# Patient Record
Sex: Female | Born: 1975 | Race: White | Hispanic: No | Marital: Married | Smoking: Never smoker
Health system: Southern US, Community
[De-identification: ages and names within clinical notes are randomized; demographics above are authoritative.]

---

## 2001-04-13 ENCOUNTER — Encounter: Payer: Self-pay | Admitting: Family Medicine

## 2001-04-13 ENCOUNTER — Encounter: Admission: RE | Admit: 2001-04-13 | Discharge: 2001-04-13 | Payer: Self-pay | Admitting: Family Medicine

## 2006-09-12 ENCOUNTER — Emergency Department (HOSPITAL_COMMUNITY): Admission: EM | Admit: 2006-09-12 | Discharge: 2006-09-12 | Payer: Self-pay | Admitting: Emergency Medicine

## 2006-09-15 ENCOUNTER — Emergency Department (HOSPITAL_COMMUNITY): Admission: EM | Admit: 2006-09-15 | Discharge: 2006-09-15 | Payer: Self-pay | Admitting: Emergency Medicine

## 2006-10-29 ENCOUNTER — Other Ambulatory Visit: Admission: RE | Admit: 2006-10-29 | Discharge: 2006-10-29 | Payer: Self-pay | Admitting: Obstetrics & Gynecology

## 2008-01-21 ENCOUNTER — Other Ambulatory Visit: Admission: RE | Admit: 2008-01-21 | Discharge: 2008-01-21 | Payer: Self-pay | Admitting: Obstetrics & Gynecology

## 2008-02-22 ENCOUNTER — Other Ambulatory Visit: Admission: RE | Admit: 2008-02-22 | Discharge: 2008-02-22 | Payer: Self-pay | Admitting: Obstetrics and Gynecology

## 2008-08-04 IMAGING — CT CT ABDOMEN W/ CM
1 of 3 series · 14 of 32 positions shown, 19 images · IV contrast (omnipaque)
Comparison: none

CLINICAL DATA: 30 year-old with abdominal pain and pelvic pain. 
ABDOMEN CT WITH CONTRAST:
TECHNIQUE: Multidetector CT imaging of the abdomen was performed following the standard protocol during bolus administration of intravenous contrast.
Contrast:  125 cc Omnipaque 300
TECHNIQUE: Multidetector CT imaging of the pelvis was performed following the standard protocol during bolus administration of intravenous contrast.

[Series 2: abd_pel 5.0 b40f st · axial · 0.59mm/px · z∈[-400,-40]mm · 14 of 81 slices shown, 19 images]
[im 5/81  soft-tissue]
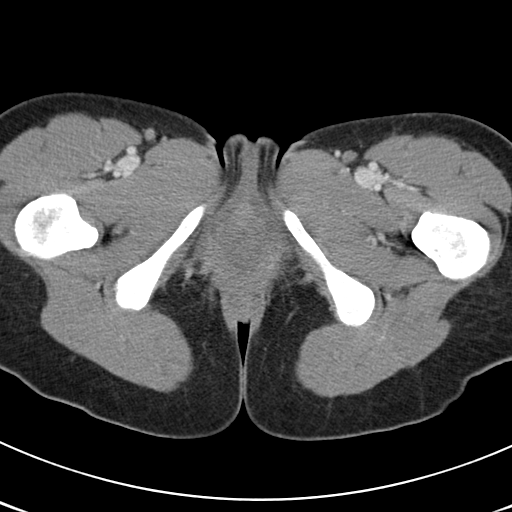
[im 5/81  bone]
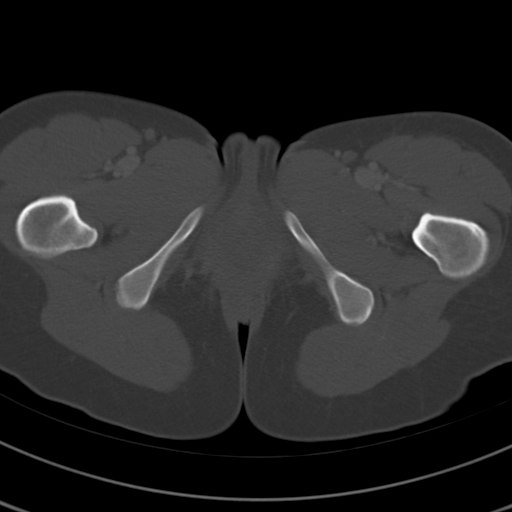
[im 13/81  soft-tissue]
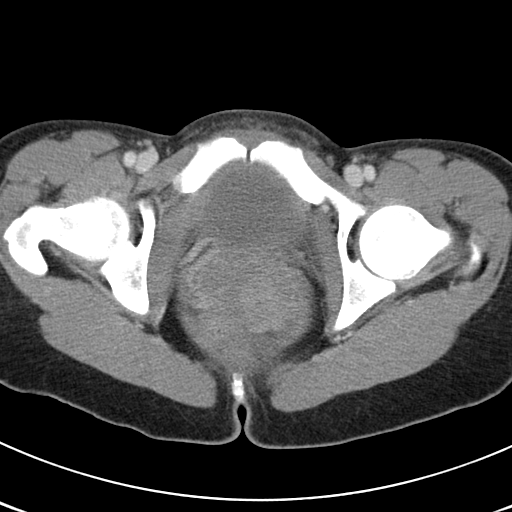
[im 17/81  soft-tissue]
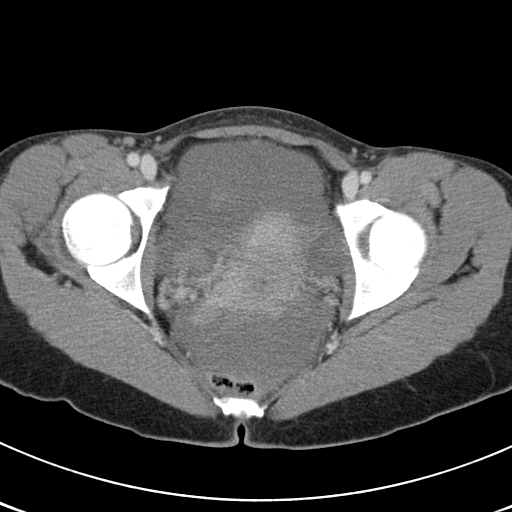
[im 25/81  soft-tissue]
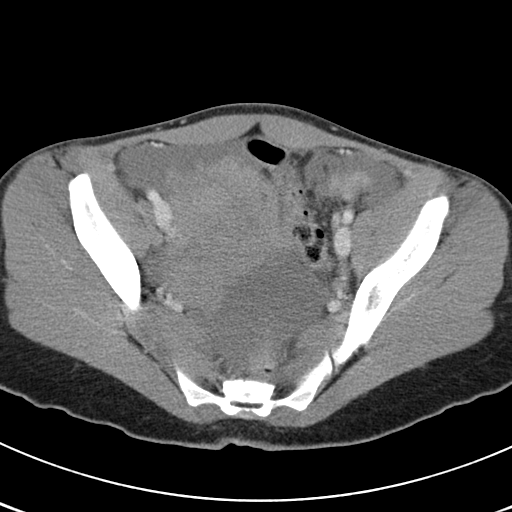
[im 29/81  soft-tissue]
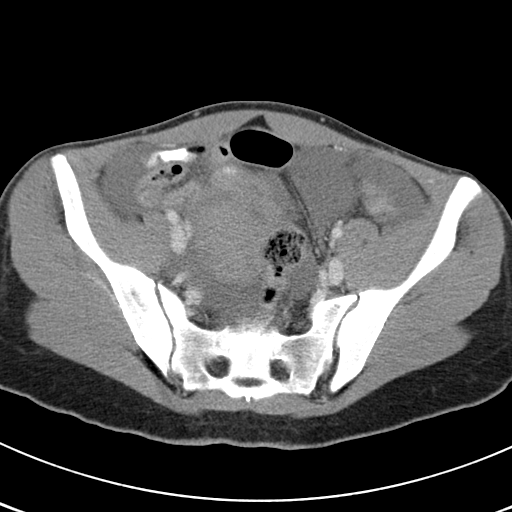
[im 37/81  soft-tissue]
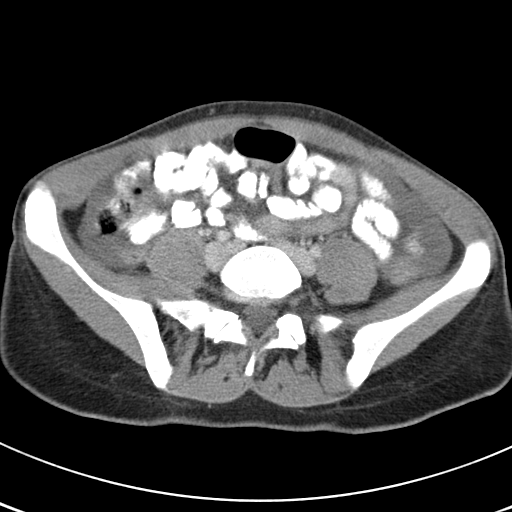
[im 41/81  soft-tissue]
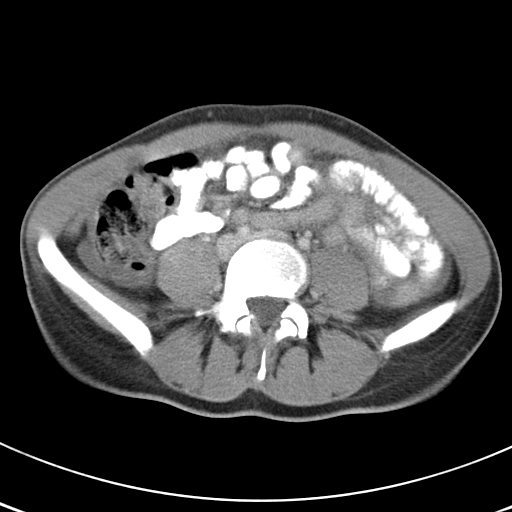
[im 45/81  soft-tissue]
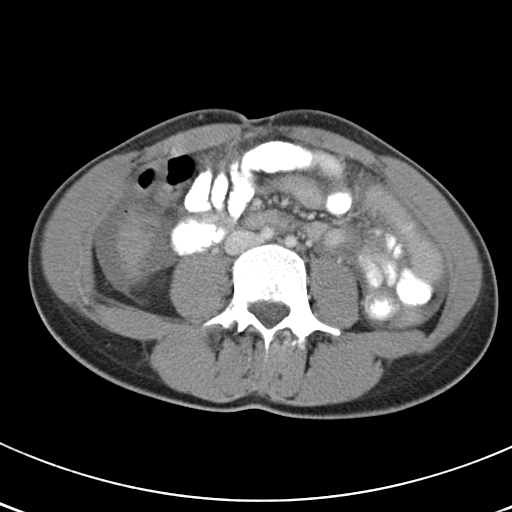
[im 53/81  soft-tissue]
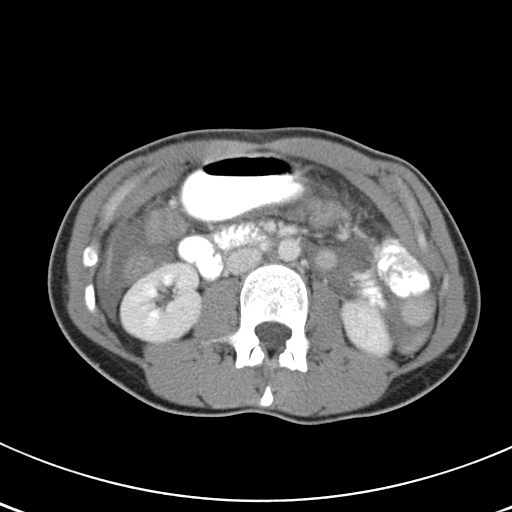
[im 53/81  bone]
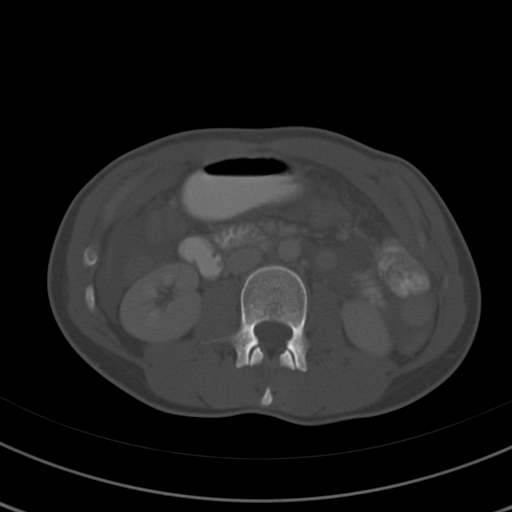
[im 57/81  soft-tissue]
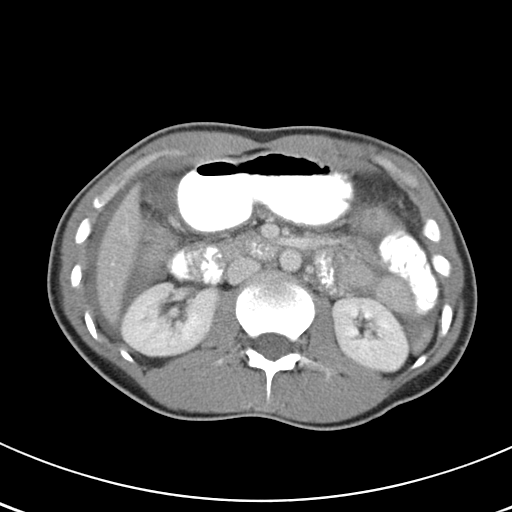
[im 65/81  soft-tissue]
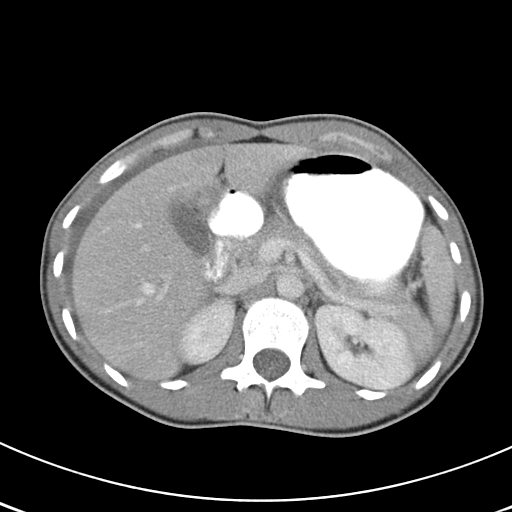
[im 65/81  lung]
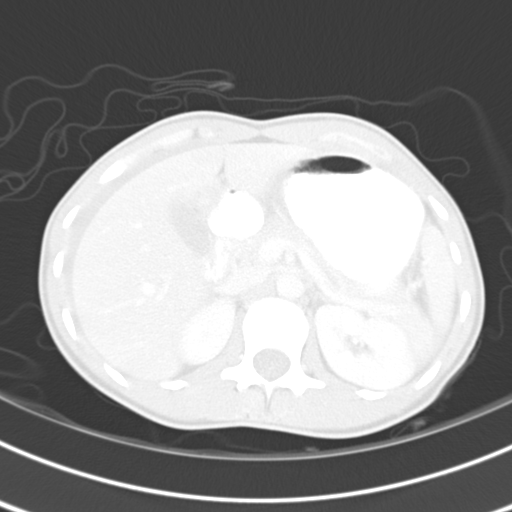
[im 69/81  soft-tissue]
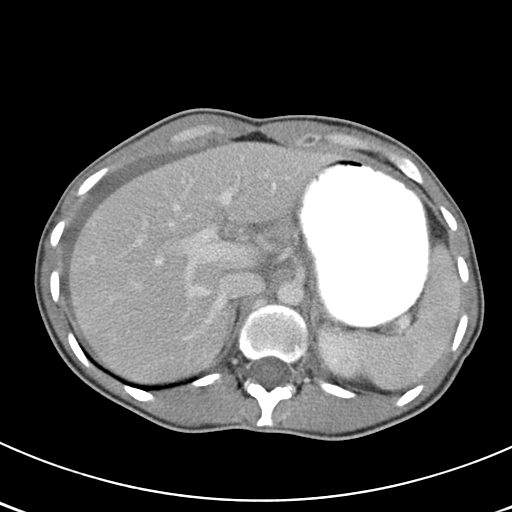
[im 69/81  lung]
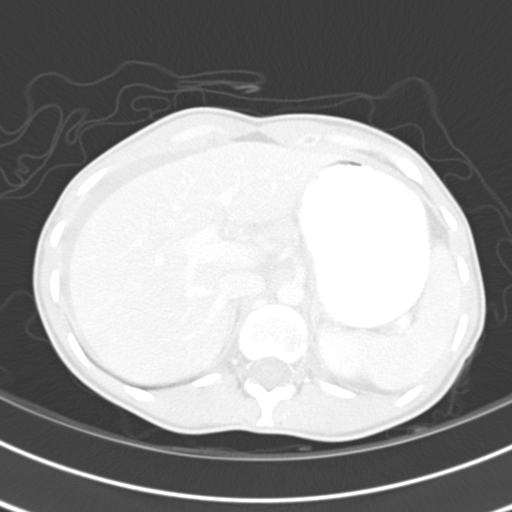
[im 73/81  lung]
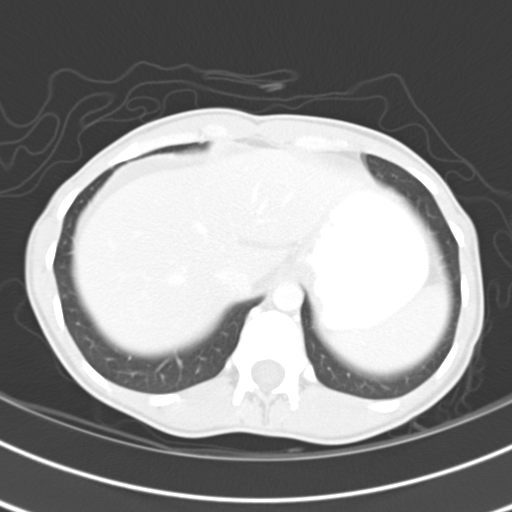
[im 77/81  soft-tissue]
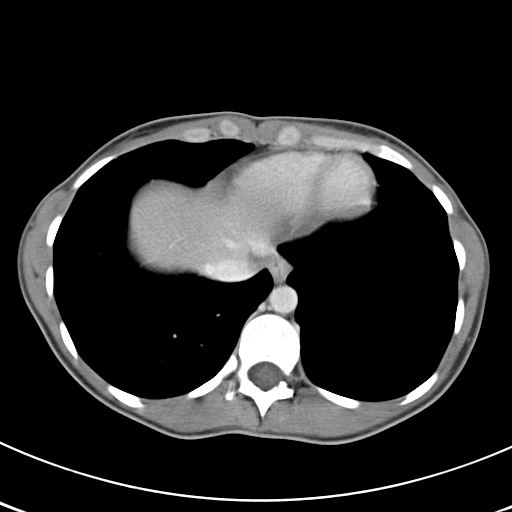
[im 77/81  lung]
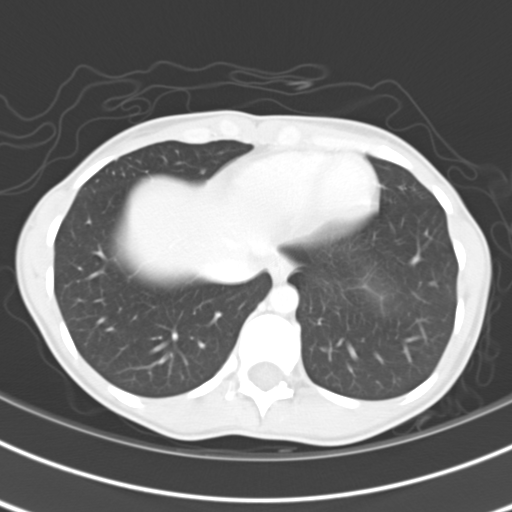

[14 of 32 positions shown; findings below may reference images not displayed]

FINDINGS: Lung bases are clear.  Heart size is normal.  There is a tiny nodule at the left lung base which is probably a benign intrapulmonary lymph node.
There is a moderate amount of free abdominal fluid around the liver and spleen.  The liver, spleen, pancreas, adrenal glands, and kidneys demonstrate no significant finding.  The stomach, duodenum, small bowel, and colon are unremarkable.  Aorta is normal in caliber.  No mesenteric or retroperitoneal masses or adenopathy.  Gallbladder appears normal.
IMPRESSION: 1.  Moderate amount of free abdominal fluid without obvious cause in the abdomen.  The major solid parenchymal organs are normal.
PELVIS CT WITH CONTRAST:
FINDINGS: There is a right adnexal process with an area of low attenuation surrounded by some areas of higher attenuation.  There is also a large amount of free pelvic fluid along with some higher attenuation material which is likely hemorrhage.  There is a right adnexal process that may be a ruptured cyst.  Would recommend pregnancy test to make sure this is not a ruptured ectopic pregnancy.  Small amount of fluid in the endometrial cavity.  Left ovary is not well seen.  The appendix is not visualized for certain.
IMPRESSION: 1.  Large amount of free fluid and blood in the pelvis.  This likely a ruptured ovarian cyst but need to exclude a ruptured ectopic pregnancy.

## 2010-12-20 ENCOUNTER — Other Ambulatory Visit: Payer: Self-pay | Admitting: Family Medicine

## 2010-12-20 ENCOUNTER — Encounter: Payer: Self-pay | Admitting: Family Medicine

## 2010-12-20 ENCOUNTER — Other Ambulatory Visit (HOSPITAL_COMMUNITY)
Admission: RE | Admit: 2010-12-20 | Discharge: 2010-12-20 | Disposition: A | Discharge status: Home / Self Care | Payer: Self-pay | Source: Ambulatory Visit | Attending: Family Medicine | Admitting: Family Medicine

## 2010-12-20 DIAGNOSIS — N72 Inflammatory disease of cervix uteri: Secondary | ICD-10-CM | POA: Insufficient documentation

## 2010-12-20 DIAGNOSIS — R87612 Low grade squamous intraepithelial lesion on cytologic smear of cervix (LGSIL): Secondary | ICD-10-CM

## 2010-12-20 LAB — POCT PREGNANCY, URINE: Preg Test, Ur: NEGATIVE

## 2011-01-18 ENCOUNTER — Ambulatory Visit (INDEPENDENT_AMBULATORY_CARE_PROVIDER_SITE_OTHER): Payer: 59 | Admitting: Family Medicine

## 2011-01-18 DIAGNOSIS — R87612 Low grade squamous intraepithelial lesion on cytologic smear of cervix (LGSIL): Secondary | ICD-10-CM

## 2012-02-10 ENCOUNTER — Other Ambulatory Visit: Payer: Self-pay | Admitting: Dermatology

## 2014-09-13 ENCOUNTER — Emergency Department
Admission: EM | Admit: 2014-09-13 | Discharge: 2014-09-13 | Disposition: A | Discharge status: Home / Self Care | Payer: BLUE CROSS/BLUE SHIELD | Source: Home / Self Care | Attending: Emergency Medicine | Admitting: Emergency Medicine

## 2014-09-13 ENCOUNTER — Encounter: Payer: Self-pay | Admitting: *Deleted

## 2014-09-13 DIAGNOSIS — S29011A Strain of muscle and tendon of front wall of thorax, initial encounter: Secondary | ICD-10-CM | POA: Diagnosis not present

## 2014-09-13 MED ORDER — METHOCARBAMOL 500 MG PO TABS: 500.0000 mg | ORAL_TABLET | Freq: Two times a day (BID) | ORAL | Status: DC

## 2014-09-13 MED ORDER — IBUPROFEN 800 MG PO TABS: 800.0000 mg | ORAL_TABLET | Freq: Three times a day (TID) | ORAL | Status: DC

## 2014-09-13 NOTE — ED Provider Notes (Signed)
CSN: [id number]     Arrival date & time 09/13/14  1228 History   First MD Initiated Contact with Patient 09/13/14 1329     Chief Complaint  Patient presents with  . Chest Pain  . Back Pain   (Consider location/radiation/quality/duration/timing/severity/associated sxs/prior Treatment) Patient is a 39 y.o. female presenting with chest pain and back pain. The history is provided by the patient. No language interpreter was used.  Chest Pain Pain location:  R chest Pain quality: aching   Pain radiates to:  Does not radiate Pain radiates to the back: no   Pain severity:  Moderate Duration:  1 day Timing:  Constant Progression:  Improving Chronicity:  New Context: lifting and movement   Context: not breathing   Relieved by:  Nothing Worsened by:  Nothing tried Ineffective treatments:  None tried Associated symptoms: back pain   Risk factors: no surgery   Back Pain Associated symptoms: chest pain     History reviewed. No pertinent past medical history. History reviewed. No pertinent past surgical history. Family History  Problem Relation Age of Onset  . Cancer Mother     breast CA   History  Substance Use Topics  . Smoking status: Never Smoker   . Smokeless tobacco: Never Used  . Alcohol Use: Yes   OB History    No data available     Review of Systems  Cardiovascular: Positive for chest pain.  Musculoskeletal: Positive for back pain.  All other systems reviewed and are negative.   Allergies  Azithromycin  Home Medications   Prior to Admission medications   Medication Sig Start Date End Date Taking? Authorizing Provider  ibuprofen (ADVIL,MOTRIN) 800 MG tablet Take 1 tablet (800 mg total) by mouth 3 (three) times daily. 09/13/14   Elson AreasLeslie K Sofia, PA-C  methocarbamol (ROBAXIN) 500 MG tablet Take 1 tablet (500 mg total) by mouth 2 (two) times daily. 09/13/14   Elson AreasLeslie K Sofia, PA-C   BP 111/73 mmHg  Pulse 69  Resp 14  Wt 125 lb (56.7 kg)  SpO2 99%  LMP  09/08/2014 Physical Exam  Constitutional: She is oriented to person, place, and time. She appears well-developed and well-nourished.  HENT:  Head: Normocephalic.  Eyes: EOM are normal. Pupils are equal, round, and reactive to light.  Neck: Normal range of motion.  Cardiovascular: Normal rate and normal heart sounds.   Pulmonary/Chest: Effort normal and breath sounds normal. She has no wheezes.  Tender anterior chest,  Pain with movement of right arm.  Abdominal: She exhibits no distension.  Musculoskeletal: Normal range of motion.  Neurological: She is alert and oriented to person, place, and time.  Skin: Skin is warm.  Psychiatric: She has a normal mood and affect.  Nursing note and vitals reviewed.   ED Course  Procedures (including critical care time) Labs Review Labs Reviewed - No data to display  Imaging Review No results found.   MDM pain seems muscular   1. Muscle strain of chest wall, initial encounter    Ibuprofen Robaxin Return if any problems Primary care referrals    Elson AreasLeslie K Sofia, PA-C 09/13/14 1431

## 2014-09-13 NOTE — Discharge Instructions (Signed)

## 2014-09-13 NOTE — ED Notes (Signed)
Christine Bauer reports trying to clear snow/ice off the top of her car early this AM and felt a sharp pull in her right upper chest and back.

## 2014-11-09 ENCOUNTER — Other Ambulatory Visit: Payer: Self-pay | Admitting: Obstetrics and Gynecology

## 2014-11-09 DIAGNOSIS — Z803 Family history of malignant neoplasm of breast: Secondary | ICD-10-CM

## 2014-11-09 DIAGNOSIS — Z9189 Other specified personal risk factors, not elsewhere classified: Secondary | ICD-10-CM

## 2015-01-02 ENCOUNTER — Other Ambulatory Visit: Payer: BLUE CROSS/BLUE SHIELD

## 2016-01-09 ENCOUNTER — Emergency Department (HOSPITAL_BASED_OUTPATIENT_CLINIC_OR_DEPARTMENT_OTHER)
Admission: EM | Admit: 2016-01-09 | Discharge: 2016-01-10 | Disposition: A | Discharge status: Home / Self Care | Payer: BLUE CROSS/BLUE SHIELD | Attending: Emergency Medicine | Admitting: Emergency Medicine

## 2016-01-09 ENCOUNTER — Encounter (HOSPITAL_BASED_OUTPATIENT_CLINIC_OR_DEPARTMENT_OTHER): Payer: Self-pay | Admitting: *Deleted

## 2016-01-09 DIAGNOSIS — Y999 Unspecified external cause status: Secondary | ICD-10-CM | POA: Insufficient documentation

## 2016-01-09 DIAGNOSIS — Y929 Unspecified place or not applicable: Secondary | ICD-10-CM | POA: Diagnosis not present

## 2016-01-09 DIAGNOSIS — Y939 Activity, unspecified: Secondary | ICD-10-CM | POA: Insufficient documentation

## 2016-01-09 DIAGNOSIS — S91312A Laceration without foreign body, left foot, initial encounter: Secondary | ICD-10-CM | POA: Diagnosis present

## 2016-01-09 DIAGNOSIS — W11XXXA Fall on and from ladder, initial encounter: Secondary | ICD-10-CM | POA: Insufficient documentation

## 2016-01-09 NOTE — ED Notes (Signed)
Pt c/o laceration to left foot from metal object x 30 mins ago

## 2016-01-10 MED ORDER — CEPHALEXIN 250 MG PO CAPS
1000.0000 mg | ORAL_CAPSULE | Freq: Once | ORAL | Status: AC
  Administered 2016-01-10: 1000 mg via ORAL
  Filled 2016-01-10: qty 4

## 2016-01-10 MED ORDER — CEPHALEXIN 500 MG PO CAPS: 500.0000 mg | ORAL_CAPSULE | Freq: Four times a day (QID) | ORAL | Status: DC

## 2016-01-10 MED ORDER — LIDOCAINE-EPINEPHRINE 2 %-1:100000 IJ SOLN
INTRAMUSCULAR | Status: DC
Start: 2016-01-10 — End: 2016-01-10
  Filled 2016-01-10: qty 1

## 2016-01-10 MED ORDER — LIDOCAINE-EPINEPHRINE 2 %-1:100000 IJ SOLN
20.0000 mL | Freq: Once | INTRAMUSCULAR | Status: AC
  Administered 2016-01-10: 20 mL via INTRADERMAL

## 2016-01-10 NOTE — ED Notes (Signed)
Pt verbalizes understanding of d/c instructions and denies any further needs at this time. 

## 2016-01-10 NOTE — ED Provider Notes (Signed)
CSN: [id number]     Arrival date & time 01/09/16  2229 History   First MD Initiated Contact with Patient 01/10/16 0042     Chief Complaint  Patient presents with  . Laceration     (Consider location/radiation/quality/duration/timing/severity/associated sxs/prior Treatment) HPI  This is a 40 year old female who fell off a ladder about 9:30 yesterday evening with her left foot landing on a piece of metal. She has a laceration to the sole of her left foot. Bleeding is controlled. Pain is mild to moderate, worse with weightbearing. There is associated swelling and ecchymosis. She denies other injury.  History reviewed. No pertinent past medical history. History reviewed. No pertinent past surgical history. Family History  Problem Relation Age of Onset  . Cancer Mother     breast CA   Social History  Substance Use Topics  . Smoking status: Never Smoker   . Smokeless tobacco: Never Used  . Alcohol Use: Yes   OB History    No data available     Review of Systems  All other systems reviewed and are negative.   Allergies  Azithromycin  Home Medications   Prior to Admission medications   Medication Sig Start Date End Date Taking? Authorizing Provider  ibuprofen (ADVIL,MOTRIN) 800 MG tablet Take 1 tablet (800 mg total) by mouth 3 (three) times daily. 09/13/14   Elson AreasLeslie K Sofia, PA-C   BP 123/83 mmHg  Pulse 66  Temp(Src) 98.5 F (36.9 C) (Oral)  Resp 20  Ht 5\' 6"  (1.676 m)  Wt 122 lb (55.339 kg)  BMI 19.70 kg/m2  SpO2 100%  LMP 12/26/2015   Physical Exam  General: Well-developed, well-nourished female in no acute distress; appearance consistent with age of record HENT: normocephalic; atraumatic Eyes: Normal appearance Neck: supple Heart: regular rate and rhythm Lungs: Normal respiratory effort and excursion Abdomen: soft; nondistended Extremities: No deformity; full range of motion; pulses normal Neurologic: Awake, alert and oriented; motor function intact in all  extremities and symmetric; no facial droop Skin: Warm and dry; laceration sole of left foot with surrounding ecchymosis Psychiatric: Normal mood and affect    ED Course  Procedures (including critical care time)  LACERATION REPAIR Performed by: Coryn Mosso L Authorized by: Hanley SeamenMOLPUS,Angelle Isais L Consent: Verbal consent obtained. Risks and benefits: risks, benefits and alternatives were discussed Consent given by: patient Patient identity confirmed: provided demographic data Prepped and Draped in normal sterile fashion Wound explored  Laceration Location: Sole of left foot  Laceration Length: 1.5 cm  No Foreign Bodies seen or palpated  Anesthesia: local infiltration  Local anesthetic: lidocaine 2 % with epinephrine  Anesthetic total: 1.5 ml  Irrigation method: syringe Amount of cleaning: Extensive   Skin closure: 4-0 nylon   Number of sutures: 2   Technique: Simple interrupted   Patient tolerance: Patient tolerated the procedure well with no immediate complications.   MDM      Paula LibraJohn Lennon Boutwell, MD 01/10/16 0120

## 2017-03-14 ENCOUNTER — Ambulatory Visit (INDEPENDENT_AMBULATORY_CARE_PROVIDER_SITE_OTHER): Payer: BLUE CROSS/BLUE SHIELD | Admitting: Physician Assistant

## 2017-03-14 ENCOUNTER — Encounter: Payer: Self-pay | Admitting: Physician Assistant

## 2017-03-14 VITALS — BP 102/66 | HR 90 | Temp 98.3°F | Resp 18 | Ht 65.0 in | Wt 126.6 lb

## 2017-03-14 DIAGNOSIS — L239 Allergic contact dermatitis, unspecified cause: Secondary | ICD-10-CM | POA: Diagnosis not present

## 2017-03-14 MED ORDER — PREDNISONE 10 MG PO TABS: ORAL_TABLET | ORAL | 0 refills | Status: AC

## 2017-03-14 MED ORDER — TRIAMCINOLONE ACETONIDE 0.5 % EX CREA: 1.0000 "application " | TOPICAL_CREAM | Freq: Two times a day (BID) | CUTANEOUS | 0 refills | Status: DC

## 2017-03-14 NOTE — Progress Notes (Signed)
   Christine Bauer  MRN: [id number] DOB: 15-Aug-1975  PCP: Patient, No Pcp Per  Chief Complaint  Patient presents with  . Rash    waist down and pts face is itchy x2weeks getting worse     Subjective:  Pt presents to clinic for rash that she has had for about 2 weeks. She thinks that it is an allergic reaction to something as she has had something like this in the past - in the last couple of days it has gotten worse.  She took benadryl at night to help with the itching but is unable to take it during the day due to tiredness.  History is obtained by patient.  Review of Systems  Constitutional: Negative for chills and fever.  Skin: Positive for rash.    There are no active problems to display for this patient.   Current Outpatient Prescriptions on File Prior to Visit  Medication Sig Dispense Refill  . ibuprofen (ADVIL,MOTRIN) 800 MG tablet Take 1 tablet (800 mg total) by mouth 3 (three) times daily. (Patient not taking: Reported on 03/14/2017) 21 tablet 0   No current facility-administered medications on file prior to visit.     Allergies  Allergen Reactions  . Azithromycin     History reviewed. No pertinent past medical history. Social History   Social History Narrative  . No narrative on file   Social History  Substance Use Topics  . Smoking status: Never Smoker  . Smokeless tobacco: Never Used  . Alcohol use Yes   family history includes Cancer in her mother.     Objective:  BP 102/66   Pulse 90   Temp 98.3 F (36.8 C) (Oral)   Resp 18   Ht 5\' 5"  (1.651 m)   Wt 126 lb 9.6 oz (57.4 kg)   LMP 03/14/2017 Comment: currently on  SpO2 98%   BMI 21.07 kg/m  Body mass index is 21.07 kg/m.  Physical Exam  Constitutional: She is oriented to person, place, and time and well-developed, well-nourished, and in no distress.  HENT:  Head: Normocephalic and atraumatic.  Right Ear: Hearing and external ear normal.  Left Ear: Hearing and external ear normal.    Eyes: Conjunctivae are normal.  Neck: Normal range of motion.  Pulmonary/Chest: Effort normal.  Neurological: She is alert and oriented to person, place, and time. Gait normal.  Skin: Skin is warm and dry.  Psychiatric: Mood, memory, affect and judgment normal.  Vitals reviewed.   Assessment and Plan :  Allergic contact dermatitis, unspecified trigger - Plan: predniSONE (DELTASONE) 10 MG tablet, triamcinolone cream (KENALOG) 0.5 %   Unsure of cause of rash - bites vs contact - treat the inflammation and f/u if problems.  Continue the benadryl at night.  Benny Lennert PA-C  Primary Care at [facility] 03/14/2017 3:17 PM

## 2017-03-14 NOTE — Patient Instructions (Signed)
     IF you received an x-ray today, you will receive an invoice from [facility]. Please contact  Radiology at [phone] with questions or concerns regarding your invoice.   IF you received labwork today, you will receive an invoice from [facility]. Please contact [facility] at [phone] with questions or concerns regarding your invoice.   Our billing staff will not be able to assist you with questions regarding bills from these companies.  You will be contacted with the lab results as soon as they are available. The fastest way to get your results is to activate your My Chart account. Instructions are located on the last page of this paperwork. If you have not heard from us regarding the results in 2 weeks, please contact this office.     

## 2018-06-14 ENCOUNTER — Encounter (HOSPITAL_BASED_OUTPATIENT_CLINIC_OR_DEPARTMENT_OTHER): Payer: Self-pay | Admitting: *Deleted

## 2018-06-14 ENCOUNTER — Emergency Department (HOSPITAL_BASED_OUTPATIENT_CLINIC_OR_DEPARTMENT_OTHER): Payer: BLUE CROSS/BLUE SHIELD

## 2018-06-14 ENCOUNTER — Other Ambulatory Visit: Payer: Self-pay

## 2018-06-14 ENCOUNTER — Emergency Department (HOSPITAL_BASED_OUTPATIENT_CLINIC_OR_DEPARTMENT_OTHER)
Admission: EM | Admit: 2018-06-14 | Discharge: 2018-06-14 | Disposition: A | Discharge status: Home / Self Care | Payer: BLUE CROSS/BLUE SHIELD | Attending: Emergency Medicine | Admitting: Emergency Medicine

## 2018-06-14 DIAGNOSIS — T1490XA Injury, unspecified, initial encounter: Secondary | ICD-10-CM

## 2018-06-14 DIAGNOSIS — Y929 Unspecified place or not applicable: Secondary | ICD-10-CM | POA: Insufficient documentation

## 2018-06-14 DIAGNOSIS — S91114A Laceration without foreign body of right lesser toe(s) without damage to nail, initial encounter: Secondary | ICD-10-CM

## 2018-06-14 DIAGNOSIS — W2203XA Walked into furniture, initial encounter: Secondary | ICD-10-CM | POA: Insufficient documentation

## 2018-06-14 DIAGNOSIS — Y999 Unspecified external cause status: Secondary | ICD-10-CM | POA: Insufficient documentation

## 2018-06-14 DIAGNOSIS — Y939 Activity, unspecified: Secondary | ICD-10-CM | POA: Insufficient documentation

## 2018-06-14 MED ORDER — CEPHALEXIN 500 MG PO CAPS: 500.0000 mg | ORAL_CAPSULE | Freq: Four times a day (QID) | ORAL | 0 refills | Status: AC

## 2018-06-14 NOTE — ED Triage Notes (Signed)
Pt reports lac between 4th and 5th toes on right foot

## 2018-06-14 NOTE — ED Provider Notes (Signed)
[facility] EMERGENCY DEPARTMENT Provider Note   CSN: [id number] Arrival date & time: 06/14/18  2016     History   Chief Complaint Chief Complaint  Patient presents with  . Laceration    HPI Christine Bauer is a 42 y.o. female.  Patient presents today with laceration of the web of her right fourth and fifth digit.  Patient was painting in the bedroom and accidentally kicked the bed frame resulting in the laceration.  There was minimal bleeding associated at the site.  Patient states she did not have any foreign body embedded into the wound.  She did apply hydrogen peroxide on the area and came to be evaluated.  She reports her last tetanus was 3 years ago.     History reviewed. No pertinent past medical history.  There are no active problems to display for this patient.   History reviewed. No pertinent surgical history.   OB History   None      Home Medications    Prior to Admission medications   Medication Sig Start Date End Date Taking? Authorizing Provider  cephALEXin (KEFLEX) 500 MG capsule Take 1 capsule (500 mg total) by mouth 4 (four) times daily for 5 days. 06/14/18 06/19/18  Wendee Beavers, DO  ibuprofen (ADVIL,MOTRIN) 800 MG tablet Take 1 tablet (800 mg total) by mouth 3 (three) times daily. Patient not taking: Reported on 03/14/2017 09/13/14   Elson Areas, PA-C  triamcinolone cream (KENALOG) 0.5 % Apply 1 application topically 2 (two) times daily. 03/14/17   Valarie Cones, Dema Severin, PA-C    Family History Family History  Problem Relation Age of Onset  . Cancer Mother        breast CA    Social History Social History   Tobacco Use  . Smoking status: Never Smoker  . Smokeless tobacco: Never Used  Substance Use Topics  . Alcohol use: Yes  . Drug use: No     Allergies   Azithromycin   Review of Systems Review of Systems  Constitutional: Negative for chills and fever.  Respiratory: Negative for cough and shortness of breath.     Cardiovascular: Negative for chest pain and leg swelling.  Gastrointestinal: Negative for nausea and vomiting.  Musculoskeletal: Negative for arthralgias, joint swelling and myalgias.  Skin: Positive for wound. Negative for color change.  Neurological: Negative for weakness and numbness.  All other systems reviewed and are negative.    Physical Exam Updated Vital Signs BP 124/76 (BP Location: Left Arm)   Pulse 82   Temp 98.5 F (36.9 C) (Oral)   Resp 18   LMP 06/07/2018   SpO2 100%   Physical Exam  Constitutional: She appears well-developed and well-nourished. No distress.  HENT:  Head: Normocephalic and atraumatic.  Eyes: Conjunctivae are normal.  Neck: Neck supple.  Cardiovascular: Normal rate and regular rhythm.  No murmur heard. Pulmonary/Chest: Effort normal and breath sounds normal. No respiratory distress.  Musculoskeletal: She exhibits no edema.  Neurological: She is alert.  Skin: Skin is warm and dry.  Small 1.0 cm laceration to web of 4th and 5th right foot digits with minimal bleeding and no foreign body  Psychiatric: She has a normal mood and affect.  Nursing note and vitals reviewed.    ED Treatments / Results  Labs (all labs ordered are listed, but only abnormal results are displayed) Labs Reviewed - No data to display  EKG None  Radiology Dg Foot Complete Right  Result Date: 06/14/2018 CLINICAL  DATA:  Right foot swelling after penetrating injury between the fourth and fifth toes. EXAM: RIGHT FOOT COMPLETE - 3+ VIEW COMPARISON:  None. FINDINGS: Soft tissue swelling and mild soft tissue gas in the webspace between the fourth and fifth toes. No radiopaque foreign bodies. Bones appear intact. No acute fracture or dislocation. No focal bone lesion or bone destruction. IMPRESSION: Soft tissue swelling and gas in the webspace between the fourth and fifth toes. No radiopaque foreign bodies. No acute bony abnormalities. Electronically Signed   By: Burman NievesWilliam   Stevens M.D.   On: 06/14/2018 21:57    Procedures Procedures (including critical care time)  Medications Ordered in ED Medications - No data to display   Initial Impression / Assessment and Plan / ED Course  I have reviewed the triage vital signs and the nursing notes.  Pertinent labs & imaging results that were available during my care of the patient were reviewed by me and considered in my medical decision making (see chart for details).  Patient is a 42 year old female presenting with a laceration of the web of her right fourth and fifth toe.  There is no foreign body embedded.  Laceration is clean with copious irrigation using 250 mL sterile water.  Tetanus up-to-date. Given mechanism, will rule-out fracture with radiograph of right foot.  Right foot revealed no fracture or osseous changes. There was associated soft tissue swelling and gas in the area. Will treat with course of Keflex. Reviewed return precautions.  Final Clinical Impressions(s) / ED Diagnoses   Final diagnoses:  Laceration of lesser toe of right foot without foreign body present, initial encounter    ED Discharge Orders         Ordered    cephALEXin (KEFLEX) 500 MG capsule  4 times daily     06/14/18 2217           Wendee BeaversMcMullen, Contrina Orona J, DO 06/14/18 2218    Little, Ambrose Finlandachel Morgan, MD 06/16/18 1407

## 2019-03-31 ENCOUNTER — Encounter (HOSPITAL_BASED_OUTPATIENT_CLINIC_OR_DEPARTMENT_OTHER): Payer: Self-pay

## 2019-03-31 ENCOUNTER — Emergency Department (HOSPITAL_BASED_OUTPATIENT_CLINIC_OR_DEPARTMENT_OTHER)
Admission: EM | Admit: 2019-03-31 | Discharge: 2019-03-31 | Disposition: A | Discharge status: Home / Self Care | Payer: BC Managed Care – PPO | Attending: Emergency Medicine | Admitting: Emergency Medicine

## 2019-03-31 ENCOUNTER — Other Ambulatory Visit: Payer: Self-pay

## 2019-03-31 DIAGNOSIS — Y999 Unspecified external cause status: Secondary | ICD-10-CM | POA: Diagnosis not present

## 2019-03-31 DIAGNOSIS — S0181XA Laceration without foreign body of other part of head, initial encounter: Secondary | ICD-10-CM | POA: Diagnosis not present

## 2019-03-31 DIAGNOSIS — Y9389 Activity, other specified: Secondary | ICD-10-CM | POA: Insufficient documentation

## 2019-03-31 DIAGNOSIS — Z881 Allergy status to other antibiotic agents status: Secondary | ICD-10-CM | POA: Insufficient documentation

## 2019-03-31 DIAGNOSIS — W228XXA Striking against or struck by other objects, initial encounter: Secondary | ICD-10-CM | POA: Diagnosis not present

## 2019-03-31 DIAGNOSIS — Y929 Unspecified place or not applicable: Secondary | ICD-10-CM | POA: Insufficient documentation

## 2019-03-31 MED ORDER — LIDOCAINE-EPINEPHRINE (PF) 2 %-1:200000 IJ SOLN
10.0000 mL | Freq: Once | INTRAMUSCULAR | Status: AC
  Administered 2019-03-31: 10 mL
  Filled 2019-03-31 (×2): qty 10

## 2019-03-31 NOTE — ED Triage Notes (Signed)
Pt states just prior to arrival she was changing oil in car when a pipe flew back and cut her face-lac noted to right temporal area-oozing noted-NAD-steady gait

## 2019-03-31 NOTE — ED Provider Notes (Signed)
[place] EMERGENCY DEPARTMENT Provider Note   CSN: [id number] Arrival date & time: 03/31/19  1715     History   Chief Complaint Chief Complaint  Patient presents with  . Facial Laceration    HPI Christine Bauer is a 43 y.o. female who is previously healthy who presents with right-sided forehead laceration.  Patient reports she was working on a car and a pipe flew back and hit her face.  Bleeding controlled on arrival.  No other injuries.  No vision changes.  Tetanus is up-to-date.     HPI  History reviewed. No pertinent past medical history.  There are no active problems to display for this patient.   History reviewed. No pertinent surgical history.   OB History   No obstetric history on file.      Home Medications    Prior to Admission medications   Medication Sig Start Date End Date Taking? Authorizing Provider  ibuprofen (ADVIL,MOTRIN) 800 MG tablet Take 1 tablet (800 mg total) by mouth 3 (three) times daily. Patient not taking: Reported on 03/14/2017 09/13/14   Fransico Meadow, PA-C  triamcinolone cream (KENALOG) 0.5 % Apply 1 application topically 2 (two) times daily. 03/14/17   Gale Journey, Damaris Hippo, PA-C    Family History Family History  Problem Relation Age of Onset  . Cancer Mother        breast CA    Social History Social History   Tobacco Use  . Smoking status: Never Smoker  . Smokeless tobacco: Never Used  Substance Use Topics  . Alcohol use: Yes    Comment: occ  . Drug use: No     Allergies   Azithromycin   Review of Systems Review of Systems  Constitutional: Negative for fever.  Eyes: Negative for visual disturbance.  Skin: Positive for wound.     Physical Exam Updated Vital Signs BP 113/88 (BP Location: Left Arm)   Pulse 72   Temp 99.4 F (37.4 C) (Oral)   Resp 18   Ht 5\' 5"  (1.651 m)   Wt 53.5 kg   LMP 03/10/2019   SpO2 100%   BMI 19.64 kg/m   Physical Exam Vitals signs and nursing note reviewed.   Constitutional:      General: She is not in acute distress.    Appearance: She is well-developed. She is not diaphoretic.  HENT:     Head: Normocephalic and atraumatic.      Mouth/Throat:     Pharynx: No oropharyngeal exudate.  Eyes:     General: No scleral icterus.       Right eye: No discharge.        Left eye: No discharge.     Conjunctiva/sclera: Conjunctivae normal.  Neck:     Musculoskeletal: Normal range of motion and neck supple.     Thyroid: No thyromegaly.  Cardiovascular:     Rate and Rhythm: Normal rate and regular rhythm.     Heart sounds: Normal heart sounds. No murmur. No friction rub. No gallop.   Pulmonary:     Effort: Pulmonary effort is normal. No respiratory distress.     Breath sounds: Normal breath sounds. No stridor. No wheezing or rales.  Lymphadenopathy:     Cervical: No cervical adenopathy.  Skin:    General: Skin is warm and dry.     Coloration: Skin is not pale.     Findings: No rash.  Neurological:     Mental Status: She is alert.  Coordination: Coordination normal.      ED Treatments / Results  Labs (all labs ordered are listed, but only abnormal results are displayed) Labs Reviewed - No data to display  EKG None  Radiology No results found.  Procedures .Marland Kitchen.Laceration Repair  Date/Time: 03/31/2019 6:58 PM Performed by: Emi HolesLaw, Karoline Fleer M, PA-C Authorized by: Emi HolesLaw, Jermani Eberlein M, PA-C   Consent:    Consent obtained:  Verbal   Consent given by:  Patient   Risks discussed:  Infection, pain and poor cosmetic result   Alternatives discussed:  No treatment Anesthesia (see MAR for exact dosages):    Anesthesia method:  Local infiltration   Local anesthetic:  Lidocaine 2% WITH epi Laceration details:    Location:  Face   Face location:  Forehead   Length (cm):  1.5   Depth (mm):  2 Repair type:    Repair type:  Simple Pre-procedure details:    Preparation:  Patient was prepped and draped in usual sterile fashion Exploration:     Hemostasis achieved with:  Direct pressure and epinephrine   Wound exploration: wound explored through full range of motion     Wound extent: no muscle damage noted     Contaminated: no   Treatment:    Area cleansed with:  Shur-Clens   Amount of cleaning:  Standard   Irrigation method:  Pressure wash   Visualized foreign bodies/material removed: no   Skin repair:    Repair method:  Sutures   Suture size:  5-0   Suture material:  Fast-absorbing gut   Suture technique:  Simple interrupted   Number of sutures:  3 Approximation:    Approximation:  Close Post-procedure details:    Dressing:  Adhesive bandage   Patient tolerance of procedure:  Tolerated well, no immediate complications   (including critical care time)  Medications Ordered in ED Medications  lidocaine-EPINEPHrine (XYLOCAINE W/EPI) 2 %-1:200000 (PF) injection 10 mL (10 mLs Infiltration Given by Other 03/31/19 1851)     Initial Impression / Assessment and Plan / ED Course  I have reviewed the triage vital signs and the nursing notes.  Pertinent labs & imaging results that were available during my care of the patient were reviewed by me and considered in my medical decision making (see chart for details).        Tetanus UTD. Laceration occurred < 12 hours prior to repair. Discussed laceration care with pt and answered questions. Pt to f-u for suture removal in 5-7 days if cannot be pulled out with gentle tug and wound check sooner should there be signs of dehiscence or infection. Pt is hemodynamically stable with no complaints prior to dc.     Final Clinical Impressions(s) / ED Diagnoses   Final diagnoses:  Laceration of forehead, initial encounter    ED Discharge Orders    None       Emi HolesLaw, Chaney Maclaren M, PA-C 03/31/19 1859    Gwyneth SproutPlunkett, Whitney, MD 03/31/19 [phone]

## 2019-03-31 NOTE — Discharge Instructions (Signed)
Treatment: Keep your wound dry and dressing applied until this time tomorrow. After 24 hours, you may wash with warm soapy water. Dry and apply clean dressing. Do this daily until your sutures are removed.  Follow-up: Please follow-up with your primary care provider or return to emergency department in 5-7 days for suture removal if you cannot pull them out with a gentle tug. Be aware of signs of infection: fever, increasing pain, redness, swelling, drainage from the area. Please call your primary care provider or return to emergency department if you develop any of these symptoms or if any of the sutures come out prior to removal. Please return to the emergency department if you develop any other new or worsening symptoms.

## 2021-02-20 ENCOUNTER — Other Ambulatory Visit: Payer: Self-pay

## 2021-02-20 ENCOUNTER — Ambulatory Visit: Payer: Self-pay

## 2021-02-20 ENCOUNTER — Ambulatory Visit: Payer: 59 | Admitting: Family Medicine

## 2021-02-20 ENCOUNTER — Encounter: Payer: Self-pay | Admitting: Family Medicine

## 2021-02-20 VITALS — BP 100/60 | HR 61 | Ht 65.0 in | Wt 124.0 lb

## 2021-02-20 DIAGNOSIS — M25551 Pain in right hip: Secondary | ICD-10-CM

## 2021-02-20 DIAGNOSIS — M76891 Other specified enthesopathies of right lower limb, excluding foot: Secondary | ICD-10-CM

## 2021-02-20 NOTE — Progress Notes (Signed)
Christine Bauer - 46 y.o. female MRN [id number]  Date of birth: 1975/11/07  SUBJECTIVE:  Including CC & ROS.  Chief Complaint  Patient presents with   New Patient (Initial Visit)    Right hip pain, Patient has tried to start to run again starting February and now has noticed pain in the lower Right back and right groin area. Describes the pain as deep, sore and tight. Patient has stopped running. Patient has some weakness in R leg and numbness in foot.     Christine Bauer is a 45 y.o. female that is presenting with right hip pain.  The pain begins in the anterior proximal hip but also has pain posteriorly.  Has been occurring over the past few weeks with running.  She has been increasing the pace of her running as well as the distance.  Denies any history of similar pain.  Seems to be staying the same.  Has taken some Tylenol for the past few days.   Review of Systems See HPI   HISTORY: Past Medical, Surgical, Social, and Family History Reviewed & Updated per EMR.   Pertinent Historical Findings include:  History reviewed. No pertinent past medical history.  History reviewed. No pertinent surgical history.  Family History  Problem Relation Age of Onset   Cancer Mother        breast CA    Social History   Socioeconomic History   Marital status: Married    Spouse name: Not on file   Number of children: Not on file   Years of education: Not on file   Highest education level: Not on file  Occupational History   Not on file  Tobacco Use   Smoking status: Never   Smokeless tobacco: Never  Vaping Use   Vaping Use: Never used  Substance and Sexual Activity   Alcohol use: Yes    Comment: occ   Drug use: No   Sexual activity: Not on file  Other Topics Concern   Not on file  Social History Narrative   Not on file   Social Determinants of Health   Financial Resource Strain: Not on file  Food Insecurity: Not on file  Transportation Needs: Not on file  Physical  Activity: Not on file  Stress: Not on file  Social Connections: Not on file  Intimate Partner Violence: Not on file     PHYSICAL EXAM:  VS: BP 100/60   Pulse 61   Ht 5\' 5"  (1.651 m)   Wt 124 lb (56.2 kg)   SpO2 99%   BMI 20.63 kg/m  Physical Exam Gen: NAD, alert, cooperative with exam, well-appearing MSK:  Right hip: Normal strength resistance. Normal range of motion. No swelling or ecchymosis. Neurovascular intact  Limited ultrasound: Right hip:  No effusion within the hip joint. Has increased hyperemia associated with the anterior proximal quadricep muscles. No changes at the ASIS. No changes at the AIIS.   Summary: Findings suggest muscle strain  Ultrasound and interpretation by , MD    ASSESSMENT & PLAN:   Hip flexor tendinitis, right No significant structural changes appreciated of the joint today.  He does seem to have some instability that is some of the source.  Does have increased hyperemia changes of the hip flexors in the mid belly portion but nothing at the origins. -Counseled on home exercise therapy and supportive care. -Counseled on reducing the load and mileage and pace while building up her strength. -Provided Pennsaid samples. -Could consider  injection or physical therapy.

## 2021-02-20 NOTE — Patient Instructions (Signed)
Nice to meet you Please try the pennsaid  Please try ice  Please try the exercises   Please send me a message in MyChart with any questions or updates.  Please see me back in 4 weeks.   --Dr. Jordan Likes

## 2021-02-21 DIAGNOSIS — M25551 Pain in right hip: Secondary | ICD-10-CM | POA: Insufficient documentation

## 2021-02-21 DIAGNOSIS — M76891 Other specified enthesopathies of right lower limb, excluding foot: Secondary | ICD-10-CM | POA: Insufficient documentation

## 2021-02-21 NOTE — Assessment & Plan Note (Signed)
No significant structural changes appreciated of the joint today.  He does seem to have some instability that is some of the source.  Does have increased hyperemia changes of the hip flexors in the mid belly portion but nothing at the origins. -Counseled on home exercise therapy and supportive care. -Counseled on reducing the load and mileage and pace while building up her strength. -Provided Pennsaid samples. -Could consider injection or physical therapy.

## 2021-03-22 ENCOUNTER — Encounter: Payer: Self-pay | Admitting: Family Medicine

## 2021-03-22 ENCOUNTER — Ambulatory Visit: Payer: 59 | Admitting: Family Medicine

## 2021-03-22 ENCOUNTER — Other Ambulatory Visit: Payer: Self-pay

## 2021-03-22 ENCOUNTER — Ambulatory Visit: Payer: Self-pay

## 2021-03-22 DIAGNOSIS — M25551 Pain in right hip: Secondary | ICD-10-CM

## 2021-03-22 MED ORDER — METHYLPREDNISOLONE ACETATE 40 MG/ML IJ SUSP
40.0000 mg | Freq: Once | INTRAMUSCULAR | Status: AC
  Administered 2021-03-22: 40 mg via INTRA_ARTICULAR

## 2021-03-22 NOTE — Assessment & Plan Note (Addendum)
Having pain more lateral. Improvent in the global pain but pain is still significant enough that is limiting her from running. -Counseled on home exercise therapy and supportive care. -Injection today. -Could consider physical therapy or further imaging.

## 2021-03-22 NOTE — Patient Instructions (Signed)
Good to see you Please try ice  Please try swimming  Please send me a message in MyChart with any questions or updates.  Please see me back in 2-3 weeks. We can do a virtual visit to check in.   --Dr. Jordan Likes

## 2021-03-22 NOTE — Progress Notes (Signed)
  Christine Bauer - 45 y.o. female MRN [id number]  Date of birth: Mar 31, 1976  SUBJECTIVE:  Including CC & ROS.  No chief complaint on file.   Christine Bauer is a 45 y.o. female that is presenting with acute right lateral hip pain.  Pain is limiting her from doing activities.  No swelling or bruising.    Review of Systems See HPI   HISTORY: Past Medical, Surgical, Social, and Family History Reviewed & Updated per EMR.   Pertinent Historical Findings include:  History reviewed. No pertinent past medical history.  History reviewed. No pertinent surgical history.  Family History  Problem Relation Age of Onset   Cancer Mother        breast CA    Social History   Socioeconomic History   Marital status: Married    Spouse name: Not on file   Number of children: Not on file   Years of education: Not on file   Highest education level: Not on file  Occupational History   Not on file  Tobacco Use   Smoking status: Never   Smokeless tobacco: Never  Vaping Use   Vaping Use: Never used  Substance and Sexual Activity   Alcohol use: Yes    Comment: occ   Drug use: No   Sexual activity: Not on file  Other Topics Concern   Not on file  Social History Narrative   Not on file   Social Determinants of Health   Financial Resource Strain: Not on file  Food Insecurity: Not on file  Transportation Needs: Not on file  Physical Activity: Not on file  Stress: Not on file  Social Connections: Not on file  Intimate Partner Violence: Not on file     PHYSICAL EXAM:  VS: BP 112/70 (BP Location: Left Arm, Patient Position: Sitting, Cuff Size: Normal)   Ht 5\' 5"  (1.651 m)   Wt 124 lb (56.2 kg)   BMI 20.63 kg/m  Physical Exam Gen: NAD, alert, cooperative with exam, well-appearing MSK:  Right hip: No swelling or ecchymosis. Tenderness to palpation of the lateral trochanter. Normal strength resistance. Neurovascular intact   Aspiration/Injection Procedure Note Christine Bauer 1975/08/12  Procedure: Injection Indications: right hip pain   Procedure Details Consent: Risks of procedure as well as the alternatives and risks of each were explained to the (patient/caregiver).  Consent for procedure obtained. Time Out: Verified patient identification, verified procedure, site/side was marked, verified correct patient position, special equipment/implants available, medications/allergies/relevent history reviewed, required imaging and test results available.  Performed.  The area was cleaned with iodine and alcohol swabs.    The right lateral greater trochanteric bursa was injected using 1 cc's of 40 mg Depo-Medrol and 4 cc's of 0.25% bupivacaine with a 22 3 1/2" needle.  Ultrasound was used. Images were obtained in short views showing the injection.     A sterile dressing was applied.  Patient did tolerate procedure well.     ASSESSMENT & PLAN:   Greater trochanteric pain syndrome of right lower extremity Having pain more lateral. Improvent in the global pain but pain is still significant enough that is limiting her from running. -Counseled on home exercise therapy and supportive care. -Injection today. -Could consider physical therapy or further imaging.

## 2022-11-14 ENCOUNTER — Encounter: Payer: Self-pay | Admitting: *Deleted
